# Patient Record
Sex: Female | Born: 1975 | Race: White | Hispanic: No | Marital: Single | Smoking: Current every day smoker
Health system: Southern US, Community
[De-identification: ages and names within clinical notes are randomized; demographics above are authoritative.]

---

## 2008-01-19 ENCOUNTER — Other Ambulatory Visit: Payer: Self-pay

## 2008-01-20 ENCOUNTER — Inpatient Hospital Stay: Payer: Self-pay | Admitting: Unknown Physician Specialty

## 2008-01-25 ENCOUNTER — Ambulatory Visit: Payer: Self-pay | Admitting: Unknown Physician Specialty

## 2008-01-30 ENCOUNTER — Ambulatory Visit: Payer: Self-pay | Admitting: Unknown Physician Specialty

## 2008-02-29 ENCOUNTER — Ambulatory Visit: Payer: Self-pay | Admitting: Unknown Physician Specialty

## 2013-04-16 ENCOUNTER — Emergency Department (HOSPITAL_COMMUNITY)
Admission: EM | Admit: 2013-04-16 | Discharge: 2013-04-16 | Disposition: A | Discharge status: Home / Self Care | Payer: Self-pay | Attending: Emergency Medicine | Admitting: Emergency Medicine

## 2013-04-16 ENCOUNTER — Encounter (HOSPITAL_COMMUNITY): Payer: Self-pay | Admitting: Emergency Medicine

## 2013-04-16 DIAGNOSIS — K089 Disorder of teeth and supporting structures, unspecified: Secondary | ICD-10-CM | POA: Insufficient documentation

## 2013-04-16 DIAGNOSIS — K0889 Other specified disorders of teeth and supporting structures: Secondary | ICD-10-CM

## 2013-04-16 MED ORDER — AMOXICILLIN 500 MG PO CAPS
500.0000 mg | ORAL_CAPSULE | Freq: Once | ORAL | Status: AC
  Administered 2013-04-16: 500 mg via ORAL
  Filled 2013-04-16: qty 1

## 2013-04-16 MED ORDER — TRAMADOL HCL 50 MG PO TABS: 100.0000 mg | ORAL_TABLET | Freq: Four times a day (QID) | ORAL | Status: DC | PRN

## 2013-04-16 MED ORDER — AMOXICILLIN 500 MG PO CAPS: 500.0000 mg | ORAL_CAPSULE | Freq: Three times a day (TID) | ORAL | Status: DC

## 2013-04-16 MED ORDER — METRONIDAZOLE 500 MG PO TABS: 500.0000 mg | ORAL_TABLET | Freq: Two times a day (BID) | ORAL | Status: DC

## 2013-04-16 MED ORDER — TRAMADOL HCL 50 MG PO TABS
100.0000 mg | ORAL_TABLET | Freq: Once | ORAL | Status: AC
  Administered 2013-04-16: 100 mg via ORAL
  Filled 2013-04-16: qty 2

## 2013-04-16 NOTE — ED Provider Notes (Signed)
History     CSN: [id number]  Arrival date & time 04/16/13  0919   First MD Initiated Contact with Patient 04/16/13 [phone]      No chief complaint on file.   (Consider location/radiation/quality/duration/timing/severity/associated sxs/prior treatment) HPI  Monique Jenkins is a 37 y.o. female complaining of left lower jaw tooth pain onset 5 days ago with increasing severity over the last 48 hours. Pain last night was 10 out of 10, it is 5/10 right now pain is exacerbated by chewing, she's been taking Aleve with minimal relief. Patient denies fever, difficulty swallowing her secretions, nausea vomiting.   No past medical history on file.  No past surgical history on file.  No family history on file.  History  Substance Use Topics  . Smoking status: Not on file  . Smokeless tobacco: Not on file  . Alcohol Use: Not on file    OB History   No data available      Review of Systems  Constitutional: Negative for fever.  HENT: Positive for dental problem. Negative for ear pain, facial swelling, drooling, trouble swallowing, neck pain, neck stiffness and voice change.   Respiratory: Negative for shortness of breath.   Cardiovascular: Negative for chest pain.  Gastrointestinal: Negative for nausea, vomiting, abdominal pain and diarrhea.  All other systems reviewed and are negative.    Allergies  Review of patient's allergies indicates no known allergies.  Home Medications  No current outpatient prescriptions on file.  There were no vitals taken for this visit.  Physical Exam  Nursing note and vitals reviewed. Constitutional: She is oriented to person, place, and time. She appears well-developed and well-nourished. No distress.  HENT:  Head: Normocephalic.  Mouth/Throat: Oropharynx is clear and moist.  Generally poor dentition, no gingival swelling, erythema or tenderness to palpation. Patient is handling their secretions. There is no tenderness to palpation or firmness  underneath tongue bilaterally. No trismus.   There is significant dental caries to the teeth 18 and 19 with erosion into the gumline  Eyes: Conjunctivae and EOM are normal. Pupils are equal, round, and reactive to light.  Neck: Normal range of motion.  Non-tender left sided LAD  Cardiovascular: Normal rate and regular rhythm.   Pulmonary/Chest: Effort normal. No stridor.  Musculoskeletal: Normal range of motion.  Lymphadenopathy:    She has cervical adenopathy.  Neurological: She is alert and oriented to person, place, and time.  Psychiatric: She has a normal mood and affect.    ED Course  Procedures (including critical care time)  Labs Reviewed - No data to display No results found.   1. Pain, dental       MDM   There were no vitals filed for this visit.   Monique Jenkins is a 37 y.o. female with toothache.  No gross abscess.  Exam unconcerning for Ludwig's angina or spread of infection.  Will treat with penicillin and pain medicine.  Urged patient to follow-up with dentist.  Patient requested nonnarcotic pain medication and she is in recovery.  Medications  amoxicillin (AMOXIL) capsule 500 mg (500 mg Oral Given 04/16/13 0947)  traMADol (ULTRAM) tablet 100 mg (100 mg Oral Given 04/16/13 0947)    Pt is hemodynamically stable, appropriate for, and amenable to discharge at this time. Pt verbalized understanding and agrees with care plan. Outpatient follow-up and specific return precautions discussed.    New Prescriptions   AMOXICILLIN (AMOXIL) 500 MG CAPSULE    Take 1 capsule (500 mg total) by mouth 3 (three)  times daily.   METRONIDAZOLE (FLAGYL) 500 MG TABLET    Take 1 tablet (500 mg total) by mouth 2 (two) times daily. One po bid x 7 days   TRAMADOL (ULTRAM) 50 MG TABLET    Take 2 tablets (100 mg total) by mouth every 6 (six) hours as needed for pain.           Wynetta Emery, PA-C 04/16/13 1006

## 2013-04-16 NOTE — ED Notes (Signed)
Pt c/o dental pain

## 2013-04-17 NOTE — ED Provider Notes (Signed)
Medical screening examination/treatment/procedure(s) were performed by non-physician practitioner and as supervising physician I was immediately available for consultation/collaboration.   Laray Anger, DO 04/17/13 [phone]

## 2016-02-29 ENCOUNTER — Emergency Department
Admission: EM | Admit: 2016-02-29 | Discharge: 2016-02-29 | Disposition: A | Discharge status: Home / Self Care | Payer: Self-pay | Attending: Emergency Medicine | Admitting: Emergency Medicine

## 2016-02-29 ENCOUNTER — Encounter: Payer: Self-pay | Admitting: Emergency Medicine

## 2016-02-29 ENCOUNTER — Emergency Department: Payer: Self-pay

## 2016-02-29 DIAGNOSIS — Y939 Activity, unspecified: Secondary | ICD-10-CM | POA: Insufficient documentation

## 2016-02-29 DIAGNOSIS — W500XXA Accidental hit or strike by another person, initial encounter: Secondary | ICD-10-CM | POA: Insufficient documentation

## 2016-02-29 DIAGNOSIS — S60221A Contusion of right hand, initial encounter: Secondary | ICD-10-CM

## 2016-02-29 DIAGNOSIS — F17200 Nicotine dependence, unspecified, uncomplicated: Secondary | ICD-10-CM | POA: Insufficient documentation

## 2016-02-29 DIAGNOSIS — Y929 Unspecified place or not applicable: Secondary | ICD-10-CM | POA: Insufficient documentation

## 2016-02-29 DIAGNOSIS — Y999 Unspecified external cause status: Secondary | ICD-10-CM | POA: Insufficient documentation

## 2016-02-29 MED ORDER — HYDROCODONE-ACETAMINOPHEN 5-325 MG PO TABS: 1.0000 | ORAL_TABLET | ORAL | Status: AC | PRN

## 2016-02-29 MED ORDER — MELOXICAM 15 MG PO TABS: 15.0000 mg | ORAL_TABLET | Freq: Every day | ORAL | Status: AC
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
## 2016-02-29 NOTE — Discharge Instructions (Signed)
Hand Contusion °A hand contusion is a deep bruise on your hand area. Contusions are the result of an injury that caused bleeding under the skin. The contusion may turn blue, purple, or yellow. Minor injuries will give you a painless contusion, but more severe contusions may stay painful and swollen for a few weeks. °CAUSES  °A contusion is usually caused by a blow, trauma, or direct force to an area of the body. °SYMPTOMS  °· Swelling and redness of the injured area. °· Discoloration of the injured area. °· Tenderness and soreness of the injured area. °· Pain. °DIAGNOSIS  °The diagnosis can be made by taking a history and performing a physical exam. An X-ray, CT scan, or MRI may be needed to determine if there were any associated injuries, such as broken bones (fractures). °TREATMENT  °Often, the best treatment for a hand contusion is resting, elevating, icing, and applying cold compresses to the injured area. Over-the-counter medicines may also be recommended for pain control. °HOME CARE INSTRUCTIONS  °· Put ice on the injured area. °· Put ice in a plastic bag. °· Place a towel between your skin and the bag. °· Leave the ice on for 15-20 minutes, 03-04 times a day. °· Only take over-the-counter or prescription medicines as directed by your caregiver. Your caregiver may recommend avoiding anti-inflammatory medicines (aspirin, ibuprofen, and naproxen) for 48 hours because these medicines may increase bruising. °· If told, use an elastic wrap as directed. This can help reduce swelling. You may remove the wrap for sleeping, showering, and bathing. If your fingers become numb, cold, or blue, take the wrap off and reapply it more loosely. °· Elevate your hand with pillows to reduce swelling. °· Avoid overusing your hand if it is painful. °SEEK IMMEDIATE MEDICAL CARE IF:  °· You have increased redness, swelling, or pain in your hand. °· Your swelling or pain is not relieved with medicines. °· You have loss of feeling in  your hand or are unable to move your fingers. °· Your hand turns cold or blue. °· You have pain when you move your fingers. °· Your hand becomes warm to the touch. °· Your contusion does not improve in 2 days. °MAKE SURE YOU:  °· Understand these instructions. °· Will watch your condition. °· Will get help right away if you are not doing well or get worse. °  °This information is not intended to replace advice given to you by your health care provider. Make sure you discuss any questions you have with your health care provider. °  °Document Released: 04/08/2002 Document Revised: 07/11/2012 Document Reviewed: 04/09/2012 °Elsevier Interactive Patient Education ©2016 [employer] ° °Cryotherapy °Cryotherapy means treatment with cold. Ice or gel packs can be used to reduce both pain and swelling. Ice is the most helpful within the first 24 to 48 hours after an injury or flare-up from overusing a muscle or joint. Sprains, strains, spasms, burning pain, shooting pain, and aches can all be eased with ice. Ice can also be used when recovering from surgery. Ice is effective, has very few side effects, and is safe for most people to use. °PRECAUTIONS  °Ice is not a safe treatment option for people with: °· Raynaud phenomenon. This is a condition affecting small blood vessels in the extremities. Exposure to cold may cause your problems to return. °· Cold hypersensitivity. There are many forms of cold hypersensitivity, including: °¨ Cold urticaria. Red, itchy hives appear on the skin when the tissues begin to warm   after being iced. °¨ Cold erythema. This is a red, itchy rash caused by exposure to cold. °¨ Cold hemoglobinuria. Red blood cells break down when the tissues begin to warm after being iced. The hemoglobin that carry oxygen are passed into the urine because they cannot combine with blood proteins fast enough. °· Numbness or altered sensitivity in the area being iced. °If you have any of the following conditions, do  not use ice until you have discussed cryotherapy with your caregiver: °· Heart conditions, such as arrhythmia, angina, or chronic heart disease. °· High blood pressure. °· Healing wounds or open skin in the area being iced. °· Current infections. °· Rheumatoid arthritis. °· Poor circulation. °· Diabetes. °Ice slows the blood flow in the region it is applied. This is beneficial when trying to stop inflamed tissues from spreading irritating chemicals to surrounding tissues. However, if you expose your skin to cold temperatures for too long or without the proper protection, you can damage your skin or nerves. Watch for signs of skin damage due to cold. °HOME CARE INSTRUCTIONS °Follow these tips to use ice and cold packs safely. °· Place a dry or damp towel between the ice and skin. A damp towel will cool the skin more quickly, so you may need to shorten the time that the ice is used. °· For a more rapid response, add gentle compression to the ice. °· Ice for no more than 10 to 20 minutes at a time. The bonier the area you are icing, the less time it will take to get the benefits of ice. °· Check your skin after 5 minutes to make sure there are no signs of a poor response to cold or skin damage. °· Rest 20 minutes or more between uses. °· Once your skin is numb, you can end your treatment. You can test numbness by very lightly touching your skin. The touch should be so light that you do not see the skin dimple from the pressure of your fingertip. When using ice, most people will feel these normal sensations in this order: cold, burning, aching, and numbness. °· Do not use ice on someone who cannot communicate their responses to pain, such as small children or people with dementia. °HOW TO MAKE AN ICE PACK °Ice packs are the most common way to use ice therapy. Other methods include ice massage, ice baths, and cryosprays. Muscle creams that cause a cold, tingly feeling do not offer the same benefits that ice offers and  should not be used as a substitute unless recommended by your caregiver. °To make an ice pack, do one of the following: °· Place crushed ice or a bag of frozen vegetables in a sealable plastic bag. Squeeze out the excess air. Place this bag inside another plastic bag. Slide the bag into a pillowcase or place a damp towel between your skin and the bag. °· Mix 3 parts water with 1 part rubbing alcohol. Freeze the mixture in a sealable plastic bag. When you remove the mixture from the freezer, it will be slushy. Squeeze out the excess air. Place this bag inside another plastic bag. Slide the bag into a pillowcase or place a damp towel between your skin and the bag. °SEEK MEDICAL CARE IF: °· You develop white spots on your skin. This may give the skin a blotchy (mottled) appearance. °· Your skin turns blue or pale. °· Your skin becomes waxy or hard. °· Your swelling gets worse. °MAKE SURE YOU:  °· Understand   these instructions. °· Will watch your condition. °· Will get help right away if you are not doing well or get worse. °  °This information is not intended to replace advice given to you by your health care provider. Make sure you discuss any questions you have with your health care provider. °  °Document Released: 06/13/2011 Document Revised: 11/07/2014 Document Reviewed: 06/13/2011 °Elsevier Interactive Patient Education ©2016 Elsevier Inc. ° °
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
## 2016-12-11 ENCOUNTER — Encounter: Payer: Self-pay | Admitting: Emergency Medicine

## 2016-12-11 ENCOUNTER — Emergency Department
Admission: EM | Admit: 2016-12-11 | Discharge: 2016-12-11 | Disposition: A | Discharge status: Home / Self Care | Payer: Self-pay | Attending: Emergency Medicine | Admitting: Emergency Medicine

## 2016-12-11 DIAGNOSIS — J0100 Acute maxillary sinusitis, unspecified: Secondary | ICD-10-CM | POA: Insufficient documentation

## 2016-12-11 DIAGNOSIS — F17200 Nicotine dependence, unspecified, uncomplicated: Secondary | ICD-10-CM | POA: Insufficient documentation

## 2016-12-11 DIAGNOSIS — Z79899 Other long term (current) drug therapy: Secondary | ICD-10-CM | POA: Insufficient documentation

## 2016-12-11 DIAGNOSIS — J029 Acute pharyngitis, unspecified: Secondary | ICD-10-CM | POA: Insufficient documentation

## 2016-12-11 MED ORDER — FIRST-DUKES MOUTHWASH MT SUSP: 10.0000 mL | Freq: Four times a day (QID) | OROMUCOSAL | 0 refills | Status: AC

## 2016-12-11 MED ORDER — FEXOFENADINE-PSEUDOEPHED ER 60-120 MG PO TB12: 1.0000 | ORAL_TABLET | Freq: Two times a day (BID) | ORAL | 0 refills | Status: AC

## 2016-12-11 MED ORDER — DIPHENHYDRAMINE HCL 12.5 MG/5ML PO ELIX
25.0000 mg | ORAL_SOLUTION | Freq: Once | ORAL | Status: AC
  Administered 2016-12-11: 25 mg via ORAL
  Filled 2016-12-11: qty 10

## 2016-12-11 MED ORDER — LIDOCAINE VISCOUS 2 % MT SOLN: 5.0000 mL | Freq: Four times a day (QID) | OROMUCOSAL | 0 refills | Status: AC | PRN

## 2016-12-11 MED ORDER — AMOXICILLIN 500 MG PO CAPS: 500.0000 mg | ORAL_CAPSULE | Freq: Three times a day (TID) | ORAL | 0 refills | Status: AC

## 2016-12-11 MED ORDER — LIDOCAINE VISCOUS 2 % MT SOLN
15.0000 mL | Freq: Once | OROMUCOSAL | Status: AC
  Administered 2016-12-11: 15 mL via OROMUCOSAL
  Filled 2016-12-11: qty 15

## 2016-12-11 NOTE — ED Notes (Signed)

## 2016-12-11 NOTE — ED Provider Notes (Signed)
[facility] Emergency Department Provider Note   ____________________________________________   First MD Initiated Contact with Patient 12/11/16 1535     (approximate)  I have reviewed the triage vital signs and the nursing notes.   HISTORY  Chief Complaint Generalized Body Aches and Nasal Congestion    HPI Monique Jenkins is a 41 y.o. female patient complain about 3 weeks of head congestion and generalized body aches. Patient also states she has a sore throat. Patient stated this been intermittent fever but has not been febrile the last couple days.Patient rates the pain as a 3/10. No palliative measures taken for her complaint.   History reviewed. No pertinent past medical history.  There are no active problems to display for this patient.   Past Surgical History:  Procedure Laterality Date  . CESAREAN SECTION      Prior to Admission medications   Medication Sig Start Date End Date Taking? Authorizing Provider  amoxicillin (AMOXIL) 500 MG capsule Take 1 capsule (500 mg total) by mouth 3 (three) times daily. 12/11/16   Joni Reining, PA-C  Diphenhyd-Hydrocort-Nystatin (FIRST-DUKES MOUTHWASH) SUSP Use as directed 10 mLs in the mouth or throat 4 (four) times daily. 12/11/16   Joni Reining, PA-C  fexofenadine-pseudoephedrine (ALLEGRA-D) 60-120 MG 12 hr tablet Take 1 tablet by mouth 2 (two) times daily. 12/11/16   Joni Reining, PA-C  HYDROcodone-acetaminophen (NORCO) 5-325 MG tablet Take 1-2 tablets by mouth every 4 (four) hours as needed for moderate pain. 02/29/16   Charmayne Sheer Beers, PA-C  lidocaine (XYLOCAINE) 2 % solution Use as directed 5 mLs in the mouth or throat every 6 (six) hours as needed for mouth pain. Mixed 5 mL with [facility] mouthwash for swish and swallow. 12/11/16   Joni Reining, PA-C  meloxicam (MOBIC) 15 MG tablet Take 1 tablet (15 mg total) by mouth daily. 02/29/16   Evangeline Dakin, PA-C    Allergies Aspirin  No family history on  file.  Social History Social History  Substance Use Topics  . Smoking status: Current Every Day Smoker    Packs/day: 1.00  . Smokeless tobacco: Never Used  . Alcohol use Yes    Review of Systems Constitutional: No fever/chills. Generalized body aches. Eyes: No visual changes. ENT: Sinus and facial pressure. Sore throat.. Cardiovascular: Denies chest pain. Respiratory: Denies shortness of breath. Gastrointestinal: No abdominal pain.  No nausea, no vomiting.  No diarrhea.  No constipation. Genitourinary: Negative for dysuria. Musculoskeletal: Negative for back pain. Skin: Negative for rash. Neurological: Positive for frontal headache but denies focal weakness or numbness. Allergic/Immunilogical: Aspirin   ____________________________________________   PHYSICAL EXAM:  VITAL SIGNS: ED Triage Vitals  Enc Vitals Group     BP 12/11/16 1404 123/84     Pulse Rate 12/11/16 1404 86     Resp 12/11/16 1404 16     Temp 12/11/16 1404 98.5 F (36.9 C)     Temp Source 12/11/16 1404 Oral     SpO2 12/11/16 1404 99 %     Weight 12/11/16 1405 130 lb (59 kg)     Height 12/11/16 1405 5\' 3"  (1.6 m)     Head Circumference --      Peak Flow --      Pain Score 12/11/16 1407 3     Pain Loc --      Pain Edu? --      Excl. in GC? --     Constitutional: Alert and oriented. Well appearing  and in no acute distress. Eyes: Conjunctivae are normal. PERRL. EOMI. Head: Atraumatic. Nose:Edematous nasal turbinates with thick rhinorrhea. Bilateral frontal and maxillary sinus guarding with palpation. Mouth/Throat: Mucous membranes are moist.  Oropharynx erythematous. Neck: No stridor. No cervical spine tenderness to palpation. Hematological/Lymphatic/Immunilogical: No cervical lymphadenopathy. Cardiovascular: Normal rate, regular rhythm. Grossly normal heart sounds.  Good peripheral circulation. Respiratory: Normal respiratory effort.  No retractions. Lungs CTAB. Gastrointestinal: Soft and  nontender. No distention. No abdominal bruits. No CVA tenderness. Musculoskeletal: No lower extremity tenderness nor edema.  No joint effusions. Neurologic:  Normal speech and language. No gross focal neurologic deficits are appreciated. No gait instability. Skin:  Skin is warm, dry and intact. No rash noted. Psychiatric: Mood and affect are normal. Speech and behavior are normal.  ____________________________________________   LABS (all labs ordered are listed, but only abnormal results are displayed)  Labs Reviewed - No data to display ____________________________________________  EKG   ____________________________________________  RADIOLOGY   ____________________________________________   PROCEDURES  Procedure(s) performed: None  Procedures  Critical Care performed: No  ____________________________________________   INITIAL IMPRESSION / ASSESSMENT AND PLAN / ED COURSE  Pertinent labs & imaging results that were available during my care of the patient were reviewed by me and considered in my medical decision making (see chart for details).  Sinusitis and pharyngitis. Patient given discharge care instructions. Patient given prescription for Allegra-D, amoxicillin, [facility] mouthwash, and viscous lidocaine. Patient given a work note. Patient advised to [facility].      ____________________________________________   FINAL CLINICAL IMPRESSION(S) / ED DIAGNOSES  Final diagnoses:  Acute maxillary sinusitis, recurrence not specified  Pharyngitis, unspecified etiology      NEW MEDICATIONS STARTED DURING THIS VISIT:  New Prescriptions   AMOXICILLIN (AMOXIL) 500 MG CAPSULE    Take 1 capsule (500 mg total) by mouth 3 (three) times daily.   DIPHENHYD-HYDROCORT-NYSTATIN (FIRST-DUKES MOUTHWASH) SUSP    Use as directed 10 mLs in the mouth or throat 4 (four) times daily.   FEXOFENADINE-PSEUDOEPHEDRINE (ALLEGRA-D) 60-120 MG 12 HR  TABLET    Take 1 tablet by mouth 2 (two) times daily.   LIDOCAINE (XYLOCAINE) 2 % SOLUTION    Use as directed 5 mLs in the mouth or throat every 6 (six) hours as needed for mouth pain. Mixed 5 mL with [facility] mouthwash for swish and swallow.     Note:  This document was prepared using Dragon voice recognition software and may include unintentional dictation errors.    Joni ReiningRonald K Abie Killian, PA-C 12/11/16 1550    Emily FilbertJonathan E Williams, MD 12/11/16 [phone]

## 2016-12-11 NOTE — Discharge Instructions (Signed)
Take ibuprofen for body aches and fever.

## 2016-12-11 NOTE — ED Triage Notes (Signed)
Pt comes into the ED via POV c/o head congestion and generalized body aches for a couple of weeks.  Patient presents in NAD at this time with even and unlabored respirations.  Denies any fevers at this time.

## 2017-01-25 ENCOUNTER — Emergency Department
Admission: EM | Admit: 2017-01-25 | Discharge: 2017-01-25 | Disposition: A | Discharge status: Home / Self Care | Payer: Self-pay | Attending: Emergency Medicine | Admitting: Emergency Medicine

## 2017-01-25 ENCOUNTER — Encounter: Payer: Self-pay | Admitting: Emergency Medicine

## 2017-01-25 DIAGNOSIS — F17200 Nicotine dependence, unspecified, uncomplicated: Secondary | ICD-10-CM | POA: Insufficient documentation

## 2017-01-25 DIAGNOSIS — J069 Acute upper respiratory infection, unspecified: Secondary | ICD-10-CM | POA: Insufficient documentation

## 2017-01-25 MED ORDER — PREDNISONE 10 MG (21) PO TBPK: ORAL_TABLET | ORAL | 0 refills | Status: AC

## 2017-01-25 MED ORDER — LEVOFLOXACIN 500 MG PO TABS
500.0000 mg | ORAL_TABLET | Freq: Once | ORAL | Status: AC
  Administered 2017-01-25: 500 mg via ORAL
  Filled 2017-01-25: qty 1

## 2017-01-25 MED ORDER — PREDNISONE 20 MG PO TABS
60.0000 mg | ORAL_TABLET | Freq: Once | ORAL | Status: AC
  Administered 2017-01-25: 60 mg via ORAL
  Filled 2017-01-25: qty 3

## 2017-01-25 MED ORDER — LEVOFLOXACIN 500 MG PO TABS: 500.0000 mg | ORAL_TABLET | Freq: Every day | ORAL | 0 refills | Status: AC

## 2017-01-25 NOTE — ED Triage Notes (Signed)
Patient ambulatory to triage with steady gait, without difficulty or distress noted, mask in place; pt reports dx with sinus infection month ago; c/o persistent sinus congestion & hoarseness and sinus pain/pressure

## 2017-01-25 NOTE — ED Provider Notes (Signed)
[facility] Emergency Department Provider Note  ____________________________________________  Time seen: Approximately 10:33 PM  I have reviewed the triage vital signs and the nursing notes.   HISTORY  Chief Complaint Nasal Congestion    HPI Monique Jenkins is a 41 y.o. female that presents to the emergency department with headache, chills, nasal congestion, productive cough with yellow sputum for 3 months. Patient states that she wakes up in the middle and night and has soaked through the sheets with sweat. Patient states that she was seen in the emergency department for same symptoms one month ago and was given amoxicillin. Symptoms have not improved at all.Patient smokes a pack of cigarettes a day since she was 39. He denies COPD, asthma, asthma allergies. She denies shortness of breath, chest pain, nausea, vomiting, abdominal pain.   History reviewed. No pertinent past medical history.  There are no active problems to display for this patient.   Past Surgical History:  Procedure Laterality Date  . CESAREAN SECTION      Prior to Admission medications   Medication Sig Start Date End Date Taking? Authorizing Provider  amoxicillin (AMOXIL) 500 MG capsule Take 1 capsule (500 mg total) by mouth 3 (three) times daily. 12/11/16   Joni Reining, PA-C  Diphenhyd-Hydrocort-Nystatin (FIRST-DUKES MOUTHWASH) SUSP Use as directed 10 mLs in the mouth or throat 4 (four) times daily. 12/11/16   Joni Reining, PA-C  fexofenadine-pseudoephedrine (ALLEGRA-D) 60-120 MG 12 hr tablet Take 1 tablet by mouth 2 (two) times daily. 12/11/16   Joni Reining, PA-C  HYDROcodone-acetaminophen (NORCO) 5-325 MG tablet Take 1-2 tablets by mouth every 4 (four) hours as needed for moderate pain. 02/29/16   Charmayne Sheer Beers, PA-C  levofloxacin (LEVAQUIN) 500 MG tablet Take 1 tablet (500 mg total) by mouth daily. 01/25/17 02/04/17  Enid Derry, PA-C  lidocaine (XYLOCAINE) 2 % solution Use as  directed 5 mLs in the mouth or throat every 6 (six) hours as needed for mouth pain. Mixed 5 mL with [facility] mouthwash for swish and swallow. 12/11/16   Joni Reining, PA-C  meloxicam (MOBIC) 15 MG tablet Take 1 tablet (15 mg total) by mouth daily. 02/29/16   Charmayne Sheer Beers, PA-C  predniSONE (STERAPRED UNI-PAK 21 TAB) 10 MG (21) TBPK tablet Take 6 tablets on day 1, take 5 tablets on day 2, take 4 tablets on day 3, take 3 tablets on day 4, take 2 tablets on day 5, take 1 tablet on day 6 01/25/17   Enid Derry, PA-C    Allergies Aspirin  No family history on file.  Social History Social History  Substance Use Topics  . Smoking status: Current Every Day Smoker    Packs/day: 1.00  . Smokeless tobacco: Never Used  . Alcohol use Yes     Review of Systems  Constitutional: Positive for fever/chills Eyes: No visual changes. No discharge. ENT: Positive for congestion and rhinorrhea. Cardiovascular: No chest pain. Respiratory: Positive for cough. No SOB. Gastrointestinal: No abdominal pain.  No nausea, no vomiting.  No diarrhea.  No constipation. Musculoskeletal: Negative for musculoskeletal pain. Skin: Negative for rash, abrasions, lacerations, ecchymosis. Neurological: Negative for headaches.   ____________________________________________   PHYSICAL EXAM:  VITAL SIGNS: ED Triage Vitals  Enc Vitals Group     BP 01/25/17 2137 120/80     Pulse Rate 01/25/17 2137 78     Resp 01/25/17 2137 18     Temp 01/25/17 2137 98.1 F (36.7 C)  Temp Source 01/25/17 2137 Oral     SpO2 01/25/17 2137 100 %     Weight 01/25/17 2138 130 lb (59 kg)     Height 01/25/17 2138 5\' 3"  (1.6 m)     Head Circumference --      Peak Flow --      Pain Score 01/25/17 2137 7     Pain Loc --      Pain Edu? --      Excl. in GC? --      Constitutional: Alert and oriented. Well appearing and in no acute distress. Eyes: Conjunctivae are normal. PERRL. EOMI. No discharge. Head: Atraumatic. ENT: No frontal  and maxillary sinus tenderness.      Ears: Tympanic membranes pearly gray with good landmarks. No discharge.      Nose: Mild congestion/rhinnorhea.      Mouth/Throat: Mucous membranes are moist. Oropharynx non-erythematous. Tonsils not enlarged. No exudates. Uvula midline. Neck: No stridor.   Hematological/Lymphatic/Immunilogical: No cervical lymphadenopathy. Cardiovascular: Normal rate, regular rhythm.  Good peripheral circulation. Respiratory: Normal respiratory effort without tachypnea or retractions. Lungs CTAB. Good air entry to the bases with no decreased or absent breath sounds. Gastrointestinal: Bowel sounds 4 quadrants. Soft and nontender to palpation. No guarding or rigidity. No palpable masses. No distention. Musculoskeletal: Full range of motion to all extremities. No gross deformities appreciated. Neurologic:  Normal speech and language. No gross focal neurologic deficits are appreciated.  Skin:  Skin is warm, dry and intact. No rash noted.   ____________________________________________   LABS (all labs ordered are listed, but only abnormal results are displayed)  Labs Reviewed - No data to display ____________________________________________  EKG   ____________________________________________  RADIOLOGY  No results found.  ____________________________________________    PROCEDURES  Procedure(s) performed:    Procedures    Medications  levofloxacin (LEVAQUIN) tablet 500 mg (500 mg Oral Given 01/25/17 2252)  predniSONE (DELTASONE) tablet 60 mg (60 mg Oral Given 01/25/17 2252)     ____________________________________________   INITIAL IMPRESSION / ASSESSMENT AND PLAN / ED COURSE  Pertinent labs & imaging results that were available during my care of the patient were reviewed by me and considered in my medical decision making (see chart for details).  Review of the [place] CSRS was performed in accordance of the [facility] prior to dispensing any controlled  drugs.     Patient's diagnosis is consistent with upper respiratory infection. Vital signs and exam are reassuring. Benefits of doing a chest x-ray were discussed with patient and I explained that results would not change my course of treatment. Patient preferred not to do the chest x-ray at this time.  Patient appears well and is staying well hydrated. Patient feels comfortable going home. Patient will be discharged home with prescriptions for levofloxacin and prednisone. She was given a dose of levofloxacin and prednisone in ED. Patient is to follow up with PCP as needed or otherwise directed. Patient is given ED precautions to return to the ED for any worsening or new symptoms.     ____________________________________________  FINAL CLINICAL IMPRESSION(S) / ED DIAGNOSES  Final diagnoses:  Upper respiratory tract infection, unspecified type      NEW MEDICATIONS STARTED DURING THIS VISIT:  Discharge Medication List as of 01/25/2017 10:45 PM    START taking these medications   Details  levofloxacin (LEVAQUIN) 500 MG tablet Take 1 tablet (500 mg total) by mouth daily., Starting Wed 01/25/2017, Until Sat 02/04/2017, Print    predniSONE (STERAPRED UNI-PAK 21 TAB) 10  MG (21) TBPK tablet Take 6 tablets on day 1, take 5 tablets on day 2, take 4 tablets on day 3, take 3 tablets on day 4, take 2 tablets on day 5, take 1 tablet on day 6, Print            This chart was dictated using voice recognition software/Dragon. Despite best efforts to proofread, errors can occur which can change the meaning. Any change was purely unintentional.    Enid DerryAshley Ragen Laver, PA-C 01/25/17 2317    Myrna Blazeravid Matthew Schaevitz, MD 01/25/17 [phone]

## 2018-09-04 ENCOUNTER — Emergency Department
Admission: EM | Admit: 2018-09-04 | Discharge: 2018-09-04 | Disposition: A | Discharge status: Home / Self Care | Payer: Self-pay | Attending: Emergency Medicine | Admitting: Emergency Medicine

## 2018-09-04 ENCOUNTER — Encounter: Payer: Self-pay | Admitting: Emergency Medicine

## 2018-09-04 ENCOUNTER — Other Ambulatory Visit: Payer: Self-pay

## 2018-09-04 ENCOUNTER — Emergency Department: Payer: Self-pay

## 2018-09-04 DIAGNOSIS — N949 Unspecified condition associated with female genital organs and menstrual cycle: Secondary | ICD-10-CM

## 2018-09-04 DIAGNOSIS — N719 Inflammatory disease of uterus, unspecified: Secondary | ICD-10-CM | POA: Insufficient documentation

## 2018-09-04 DIAGNOSIS — F17210 Nicotine dependence, cigarettes, uncomplicated: Secondary | ICD-10-CM | POA: Insufficient documentation

## 2018-09-04 DIAGNOSIS — K529 Noninfective gastroenteritis and colitis, unspecified: Secondary | ICD-10-CM | POA: Insufficient documentation

## 2018-09-04 DIAGNOSIS — R102 Pelvic and perineal pain: Secondary | ICD-10-CM

## 2018-09-04 DIAGNOSIS — Z79899 Other long term (current) drug therapy: Secondary | ICD-10-CM | POA: Insufficient documentation

## 2018-09-04 DIAGNOSIS — R109 Unspecified abdominal pain: Secondary | ICD-10-CM | POA: Insufficient documentation

## 2018-09-04 DIAGNOSIS — N83299 Other ovarian cyst, unspecified side: Secondary | ICD-10-CM | POA: Insufficient documentation

## 2018-09-04 LAB — URINALYSIS, COMPLETE (UACMP) WITH MICROSCOPIC
BILIRUBIN URINE: NEGATIVE
Bacteria, UA: NONE SEEN
GLUCOSE, UA: NEGATIVE mg/dL
HGB URINE DIPSTICK: NEGATIVE
KETONES UR: NEGATIVE mg/dL
NITRITE: NEGATIVE
PH: 7 (ref 5.0–8.0)
PROTEIN: NEGATIVE mg/dL
Specific Gravity, Urine: 1.003 — ABNORMAL LOW (ref 1.005–1.030)

## 2018-09-04 LAB — COMPREHENSIVE METABOLIC PANEL
ALBUMIN: 4 g/dL (ref 3.5–5.0)
ALT: 15 U/L (ref 0–44)
ANION GAP: 6 (ref 5–15)
AST: 19 U/L (ref 15–41)
Alkaline Phosphatase: 54 U/L (ref 38–126)
BILIRUBIN TOTAL: 0.6 mg/dL (ref 0.3–1.2)
BUN: 8 mg/dL (ref 6–20)
CO2: 22 mmol/L (ref 22–32)
Calcium: 8.4 mg/dL — ABNORMAL LOW (ref 8.9–10.3)
Chloride: 111 mmol/L (ref 98–111)
Creatinine, Ser: 0.77 mg/dL (ref 0.44–1.00)
GFR calc non Af Amer: >60 mL/min (ref >60)
GLUCOSE: 80 mg/dL (ref 70–99)
Potassium: 4.1 mmol/L (ref 3.5–5.1)
SODIUM: 139 mmol/L (ref 135–145)
TOTAL PROTEIN: 6.7 g/dL (ref 6.5–8.1)

## 2018-09-04 LAB — CBC
HCT: 41.7 % (ref 36.0–46.0)
HEMOGLOBIN: 14.1 g/dL (ref 12.0–15.0)
MCH: 32 pg (ref 26.0–34.0)
MCHC: 33.8 g/dL (ref 30.0–36.0)
MCV: 94.6 fL (ref 80.0–100.0)
Platelets: 224 10*3/uL (ref 150–400)
RBC: 4.41 MIL/uL (ref 3.87–5.11)
RDW: 12.2 % (ref 11.5–15.5)
WBC: 9.8 10*3/uL (ref 4.0–10.5)
nRBC: 0 % (ref 0.0–0.2)

## 2018-09-04 LAB — HCG, QUANTITATIVE, PREGNANCY: hCG, Beta Chain, Quant, S: <1 m[IU]/mL (ref <5)

## 2018-09-04 LAB — LIPASE, BLOOD: Lipase: 24 U/L (ref 11–51)

## 2018-09-04 LAB — POCT PREGNANCY, URINE: Preg Test, Ur: NEGATIVE

## 2018-09-04 MED ORDER — METRONIDAZOLE 500 MG PO TABS: 500.0000 mg | ORAL_TABLET | Freq: Three times a day (TID) | ORAL | 0 refills | Status: AC

## 2018-09-04 MED ORDER — ONDANSETRON HCL 4 MG/2ML IJ SOLN
4.0000 mg | Freq: Once | INTRAMUSCULAR | Status: AC
  Administered 2018-09-04: 4 mg via INTRAVENOUS
  Filled 2018-09-04: qty 2

## 2018-09-04 MED ORDER — IOHEXOL 300 MG/ML  SOLN
100.0000 mL | Freq: Once | INTRAMUSCULAR | Status: AC | PRN
  Administered 2018-09-04: 100 mL via INTRAVENOUS

## 2018-09-04 MED ORDER — AMOXICILLIN-POT CLAVULANATE 875-125 MG PO TABS: 1.0000 | ORAL_TABLET | Freq: Two times a day (BID) | ORAL | 0 refills | Status: AC

## 2018-09-04 MED ORDER — KETOROLAC TROMETHAMINE 30 MG/ML IJ SOLN
15.0000 mg | Freq: Once | INTRAMUSCULAR | Status: AC
  Administered 2018-09-04: 15 mg via INTRAVENOUS
  Filled 2018-09-04: qty 1

## 2018-09-04 MED ORDER — SODIUM CHLORIDE 0.9 % IV BOLUS
1000.0000 mL | Freq: Once | INTRAVENOUS | Status: AC
  Administered 2018-09-04: 1000 mL via INTRAVENOUS

## 2018-09-04 MED ORDER — FENTANYL CITRATE (PF) 100 MCG/2ML IJ SOLN
50.0000 ug | Freq: Once | INTRAMUSCULAR | Status: AC
  Administered 2018-09-04: 50 ug via INTRAVENOUS
  Filled 2018-09-04: qty 2

## 2018-09-04 NOTE — ED Provider Notes (Signed)
[facility] Emergency Department Provider Note  ____________________________________________  Time seen: Approximately 8:45 AM  I have reviewed the triage vital signs and the nursing notes.   HISTORY  Chief Complaint Abdominal Pain; Emesis; and Diarrhea   HPI Monique Jenkins is a 42 y.o. female no significant past medical history who presents for evaluation of nausea, vomiting, diarrhea and abdominal pain.  Patient reports that her symptoms started 3 days ago with nausea and vomiting.  That has resolved however the diarrhea has become significantly worse.  She reports at least 8 episodes of watery diarrhea in the last 24 hours.  She is reporting lower abdominal cramping radiating to her rectum has been getting progressively worse with bowel movements.  No melena or hematochezia, no coffee-ground emesis or hematemesis.  No fever or chills, no dysuria or hematuria.  PMH None - reviewed  Past Surgical History:  Procedure Laterality Date  . CESAREAN SECTION      Prior to Admission medications   Medication Sig Start Date End Date Taking? Authorizing Provider  amoxicillin (AMOXIL) 500 MG capsule Take 1 capsule (500 mg total) by mouth 3 (three) times daily. 12/11/16   Joni Reining, PA-C  amoxicillin-clavulanate (AUGMENTIN) 875-125 MG tablet Take 1 tablet by mouth 2 (two) times daily for 10 days. 09/04/18 09/14/18  Nita Sickle, MD  Diphenhyd-Hydrocort-Nystatin (FIRST-DUKES MOUTHWASH) SUSP Use as directed 10 mLs in the mouth or throat 4 (four) times daily. 12/11/16   Joni Reining, PA-C  fexofenadine-pseudoephedrine (ALLEGRA-D) 60-120 MG 12 hr tablet Take 1 tablet by mouth 2 (two) times daily. 12/11/16   Joni Reining, PA-C  HYDROcodone-acetaminophen (NORCO) 5-325 MG tablet Take 1-2 tablets by mouth every 4 (four) hours as needed for moderate pain. 02/29/16   Beers, Charmayne Sheer, PA-C  lidocaine (XYLOCAINE) 2 % solution Use as directed 5 mLs in the mouth or throat  every 6 (six) hours as needed for mouth pain. Mixed 5 mL with [facility] mouthwash for swish and swallow. 12/11/16   Joni Reining, PA-C  meloxicam (MOBIC) 15 MG tablet Take 1 tablet (15 mg total) by mouth daily. 02/29/16   Beers, Charmayne Sheer, PA-C  metroNIDAZOLE (FLAGYL) 500 MG tablet Take 1 tablet (500 mg total) by mouth 3 (three) times daily for 10 days. 09/04/18 09/14/18  Nita Sickle, MD  predniSONE (STERAPRED UNI-PAK 21 TAB) 10 MG (21) TBPK tablet Take 6 tablets on day 1, take 5 tablets on day 2, take 4 tablets on day 3, take 3 tablets on day 4, take 2 tablets on day 5, take 1 tablet on day 6 01/25/17   Enid Derry, PA-C    Allergies Aspirin  No family history on file.  Social History Social History   Tobacco Use  . Smoking status: Current Every Day Smoker    Packs/day: 1.00  . Smokeless tobacco: Never Used  Substance Use Topics  . Alcohol use: Not Currently  . Drug use: Never    Review of Systems  Constitutional: Negative for fever. Eyes: Negative for visual changes. ENT: Negative for sore throat. Neck: No neck pain  Cardiovascular: Negative for chest pain. Respiratory: Negative for shortness of breath. Gastrointestinal: + lower abdominal pain, vomiting and diarrhea. Genitourinary: Negative for dysuria. Musculoskeletal: Negative for back pain. Skin: Negative for rash. Neurological: Negative for headaches, weakness or numbness. Psych: No SI or HI  ____________________________________________   PHYSICAL EXAM:  VITAL SIGNS: Vitals:   09/04/18 1100 09/04/18 1200  BP: 112/86 126/76  Pulse: 67  64  Resp:    Temp:    SpO2: 98% 99%   Constitutional: Alert and oriented. Well appearing and in no apparent distress. HEENT:      Head: Normocephalic and atraumatic.         Eyes: Conjunctivae are normal. Sclera is non-icteric.       Mouth/Throat: Mucous membranes are moist.       Neck: Supple with no signs of meningismus. Cardiovascular: Regular rate and rhythm. No  murmurs, gallops, or rubs. 2+ symmetrical distal pulses are present in all extremities. No JVD. Respiratory: Normal respiratory effort. Lungs are clear to auscultation bilaterally. No wheezes, crackles, or rhonchi.  Gastrointestinal: Soft, diffuse tenderness on the lower quadrants, non distended with positive bowel sounds. No rebound or guarding. Genitourinary: No CVA tenderness. Musculoskeletal: Nontender with normal range of motion in all extremities. No edema, cyanosis, or erythema of extremities. Neurologic: Normal speech and language. Face is symmetric. Moving all extremities. No gross focal neurologic deficits are appreciated. Skin: Skin is warm, dry and intact. No rash noted. Psychiatric: Mood and affect are normal. Speech and behavior are normal.  ____________________________________________   LABS (all labs ordered are listed, but only abnormal results are displayed)  Labs Reviewed  COMPREHENSIVE METABOLIC PANEL - Abnormal; Notable for the following components:      Result Value   Calcium 8.4 (*)    All other components within normal limits  URINALYSIS, COMPLETE (UACMP) WITH MICROSCOPIC - Abnormal; Notable for the following components:   Color, Urine STRAW (*)    APPearance CLEAR (*)    Specific Gravity, Urine 1.003 (*)    Leukocytes, UA TRACE (*)    All other components within normal limits  LIPASE, BLOOD  CBC  HCG, QUANTITATIVE, PREGNANCY  POCT PREGNANCY, URINE  POC URINE PREG, ED   ____________________________________________  EKG  none  ____________________________________________  RADIOLOGY  I have personally reviewed the images performed during this visit and I agree with the Radiologist's read.   Interpretation by Radiologist:  Ct Abdomen Pelvis W Contrast  Result Date: 09/04/2018 CLINICAL DATA:  Nausea and vomiting for 4 days. Abdominal pain, unspecified. EXAM: CT ABDOMEN AND PELVIS WITH CONTRAST TECHNIQUE: Multidetector CT imaging of the abdomen and  pelvis was performed using the standard protocol following bolus administration of intravenous contrast. CONTRAST:  OMNIPAQUE IOHEXOL 300 MG/ML  SOLN COMPARISON:  None. FINDINGS: Lower chest: Lung bases are clear without focal nodule, mass, or airspace disease. Heart size is normal. No significant pleural or pericardial effusion. Hepatobiliary: No focal liver abnormality is seen. No gallstones, gallbladder wall thickening, or biliary dilatation. Pancreas: Mild pancreatic duct dilation is present. No obstructing mass lesion is evident. Pancreas is otherwise normal. Lipase is normal. Spleen: Normal in size without focal abnormality. Adrenals/Urinary Tract: The adrenal glands are normal bilaterally. Kidneys and ureters are unremarkable. The urinary bladder is within limits. Stomach/Bowel: Stomach and duodenum are within normal limits. The small bowel is unremarkable. The appendix is visualized and normal. The ascending and transverse colon are within limits. The descending colon is normal. There is focal wall thickening about the sigmoid colon adjacent mesenteric inflammatory change. Vascular/Lymphatic: No significant vascular findings are present. No enlarged abdominal or pelvic lymph nodes. Reproductive: The uterus is diffusely heterogeneous and edematous. Enhancing follicles are present bilaterally. Tubal ligation clips are noted. There is also a single clip posteriorly within the anatomic pelvis. A small amount of free fluid is present. Other: No ventral hernia is present. Musculoskeletal: Vertebral body heights alignment are maintained.  No focal lytic or blastic lesions are present. Bony pelvis is intact. The hips are located and within normal limits bilaterally. IMPRESSION: 1. Diffuse edematous changes of the uterus and enhancing cystic lesions in both ovaries. Please correlate with menstrual cycle. Inflammatory changes are not excluded. 2. Wall thickening of the distal sigmoid colon. This may be secondary  to adjacent inflammatory changes of the reproductive organs. Primary colitis is not excluded. 3. Free fluid in the anatomic pelvis is likely reactive. 4. Surgical clips lying dependently in the anatomic pelvis appears similar to other tubal ligation clips. Electronically Signed   By: Marin Roberts M.D.   On: 09/04/2018 11:50     ____________________________________________   PROCEDURES  Procedure(s) performed: None Procedures Critical Care performed:  None ____________________________________________   INITIAL IMPRESSION / ASSESSMENT AND PLAN / ED COURSE   42 y.o. female no significant past medical history who presents for evaluation of nausea, vomiting, diarrhea and abdominal pain.  Patient is well-appearing and in no distress, vitals show no abnormalities.  Abdomen is soft with diffuse tenderness in the lower quadrants, no rebound or guarding.  Labs including CBC, CMP, lipase, pregnancy, and urinalysis are pending.  Toradol, Zofran and fluids will be given for symptom relief. Ddx including viral gastroenteritis versus colitis versus enteritis versus C. difficile versus diverticulitis versus appendicitis.  Clinical Course as of Sep 04 1228  Tue Sep 04, 2018  1211 CT showing bilateral cyst adnexa with edematous uterus and possible colitis versus inflammatory changes from GYN pathology.  Patient is about to have her menstrual period and that could explain why she has edema in her uterus however I did recommend STD testing and transvaginal ultrasound.  Patient reports that she is been in a monogamous relationship for 2 years, she works and lives together with this man and she is 100% sure she cannot have an STD.  She has refused STD testing. She initially agrees with [place] especially to evaluate for TOA and to evaluate bilateral adnexa cysts to rule out torsion but then refuses [place] and is demanding discharge home. She agrees to take abx for colitis. I explained to her that if she has PID/ TOA/  or torsion she can get much sicker and need surgery with possible loss of her ovaries. She understands the risks and continues to demand discharge at this time.  Will provide patient with a prescription for Flagyl and Augmentin for colitis.  Discussed close follow-up with primary care doctor or return to the emergency room at any time to continue treatment.   [CV]    Clinical Course User Index [CV] Don Perking [place], MD     As part of my medical decision making, I reviewed the following data within the electronic MEDICAL RECORD NUMBER Nursing notes reviewed and incorporated, Labs reviewed , Old chart reviewed, Radiograph reviewed , Notes from prior ED visits and [place] Controlled Substance Database    Pertinent labs & imaging results that were available during my care of the patient were reviewed by me and considered in my medical decision making (see chart for details).    ____________________________________________   FINAL CLINICAL IMPRESSION(S) / ED DIAGNOSES  Final diagnoses:  Abdominal pain  Colitis  Uterine inflammation  Adnexal cyst      NEW MEDICATIONS STARTED DURING THIS VISIT:  ED Discharge Orders         Ordered    amoxicillin-clavulanate (AUGMENTIN) 875-125 MG tablet  2 times daily     09/04/18 1229    metroNIDAZOLE (FLAGYL) 500  MG tablet  3 times daily     09/04/18 1229           Note:  This document was prepared using Dragon voice recognition software and may include unintentional dictation errors.    Don Perking, [place], MD 09/04/18 1230

## 2018-09-04 NOTE — ED Notes (Signed)
Patient reports to this RN that she has been here for "4 hours and is ready to go now"; she is encouraged to continue with the treatment plan and allow for an ultrasound; but she refuses to go to ultrasound and insists on leaving now. MD Don Perking made aware.

## 2018-09-04 NOTE — ED Notes (Signed)
MD made aware of pt's desires to be discharged

## 2018-09-04 NOTE — ED Notes (Signed)
Pt states she wants to go home

## 2018-09-04 NOTE — ED Notes (Signed)
Patient transported to CT 

## 2018-09-04 NOTE — ED Triage Notes (Signed)
Patient reports abdominal cramping, N/V/D x4 days. Unsure of fever.

## 2020-01-29 IMAGING — CT CT ABD-PELV W/ CM
2 of 5 series · 16 of 46 positions shown, 18 images · IV contrast (APPLIED)
Comparison: None.

CLINICAL DATA: Nausea and vomiting for 4 days. Abdominal pain,
unspecified.

EXAM:
CT ABDOMEN AND PELVIS WITH CONTRAST
TECHNIQUE: Multidetector CT imaging of the abdomen and pelvis was performed
using the standard protocol following bolus administration of
intravenous contrast.
CONTRAST:  100mL OMNIPAQUE IOHEXOL 300 MG/ML  SOLN

[Series 2: routine abd/pel with · axial · 0.71mm/px · z∈[-462,-87]mm · 13 of 85 slices shown, 15 images]
[im 5/85  soft-tissue]
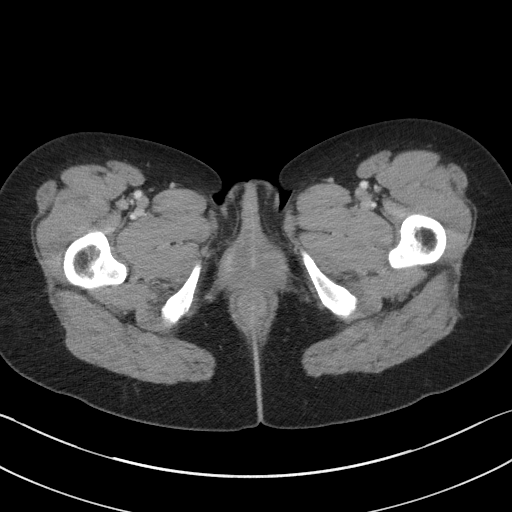
[im 5/85  bone]
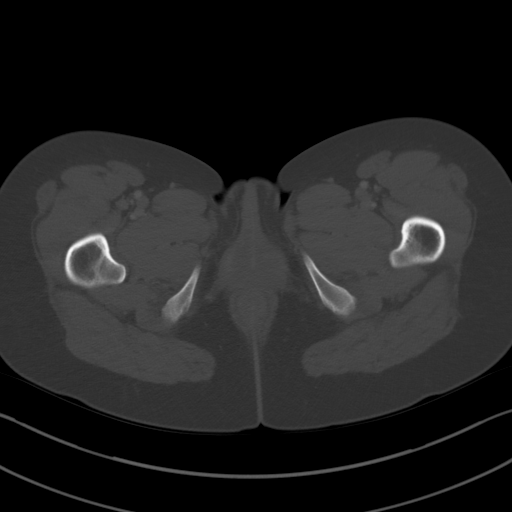
[im 14/85  soft-tissue]
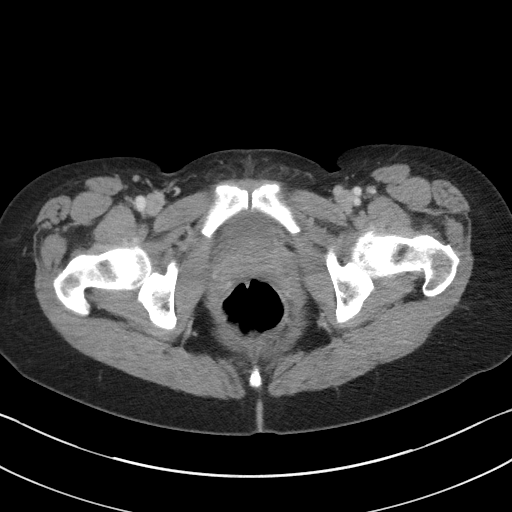
[im 18/85  soft-tissue]
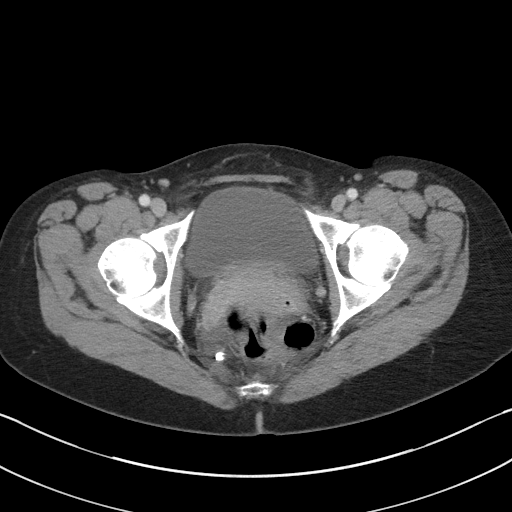
[im 23/85  soft-tissue]
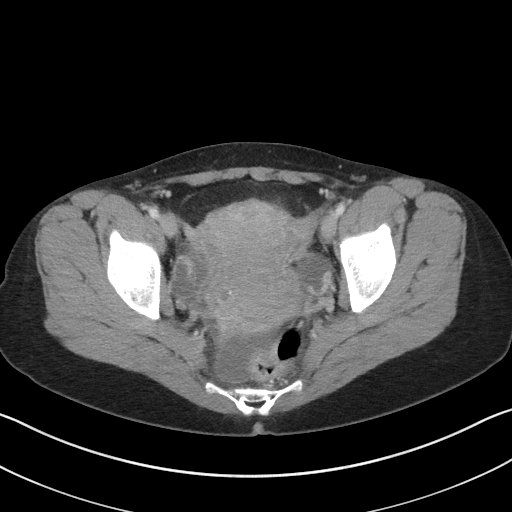
[im 31/85  soft-tissue]
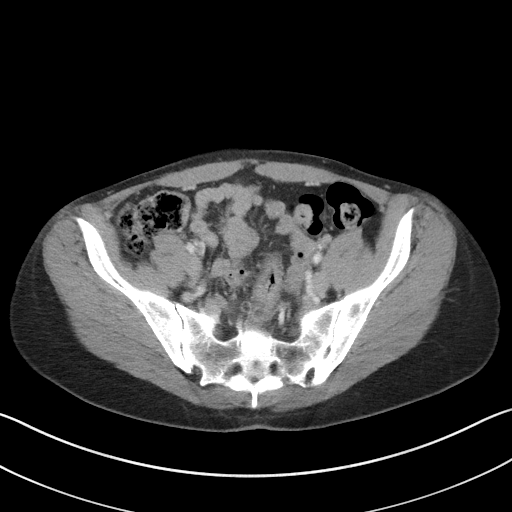
[im 36/85  soft-tissue]
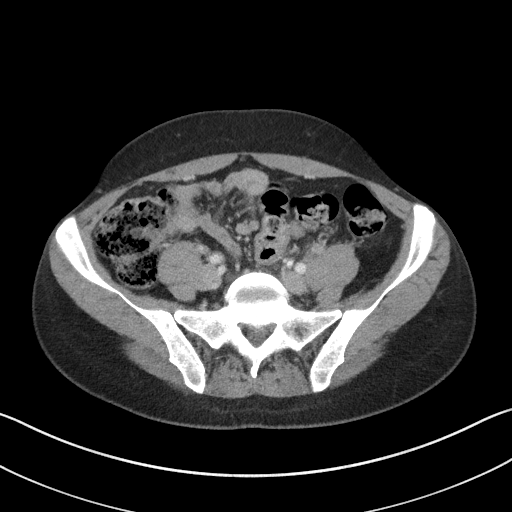
[im 45/85  soft-tissue]
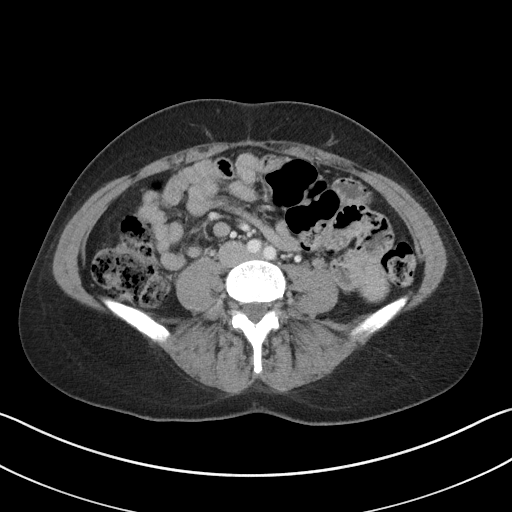
[im 49/85  soft-tissue]
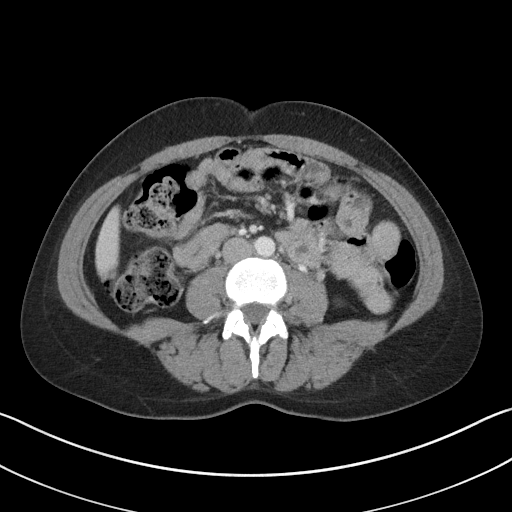
[im 54/85  soft-tissue]
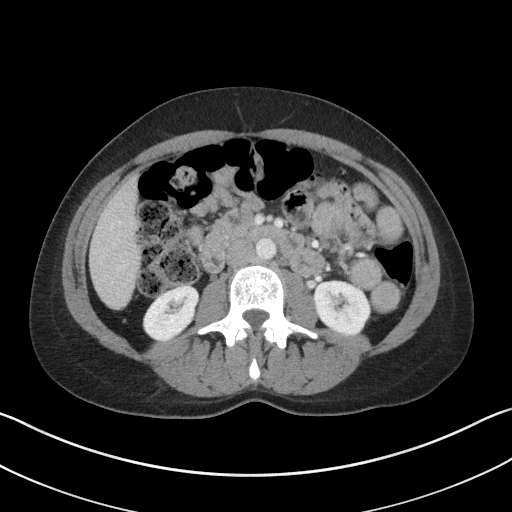
[im 54/85  bone]
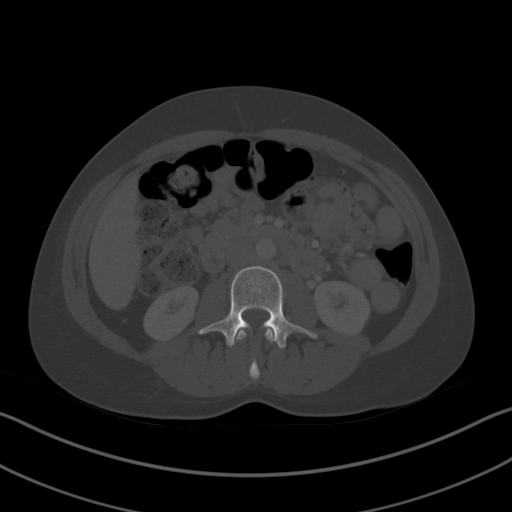
[im 62/85  soft-tissue]
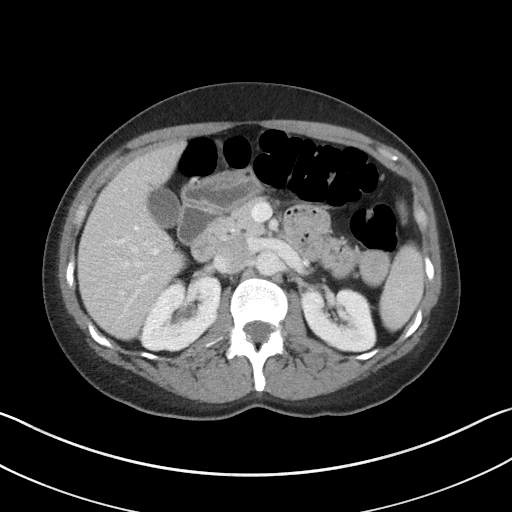
[im 67/85  soft-tissue]
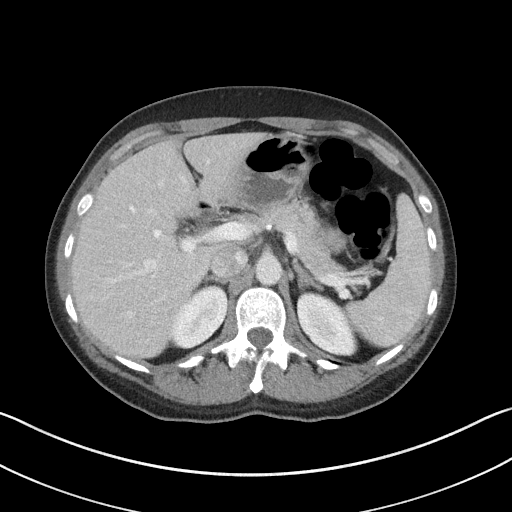
[im 71/85  soft-tissue]
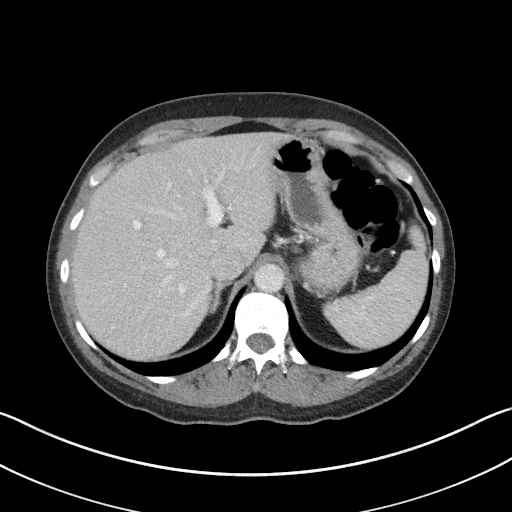
[im 80/85  soft-tissue]
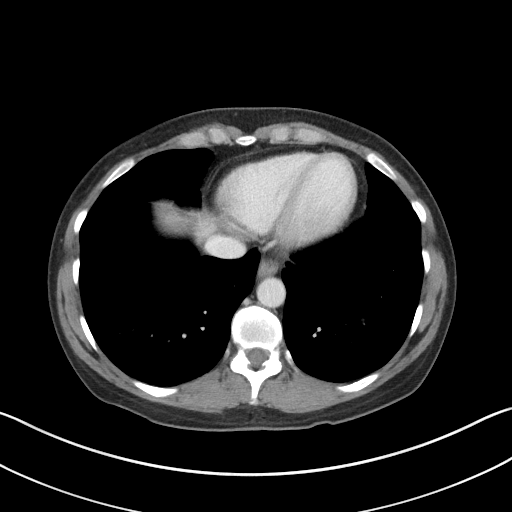

[Series 5: coronal st · coronal · 0.77mm/px · 3 of 78 slices shown]
[im 26/78  soft-tissue]
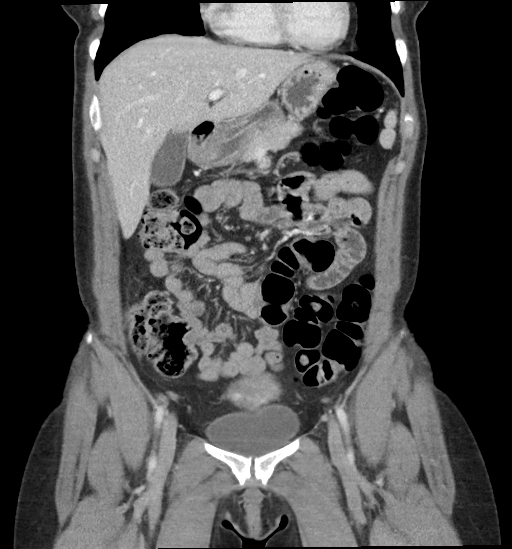
[im 35/78  soft-tissue]
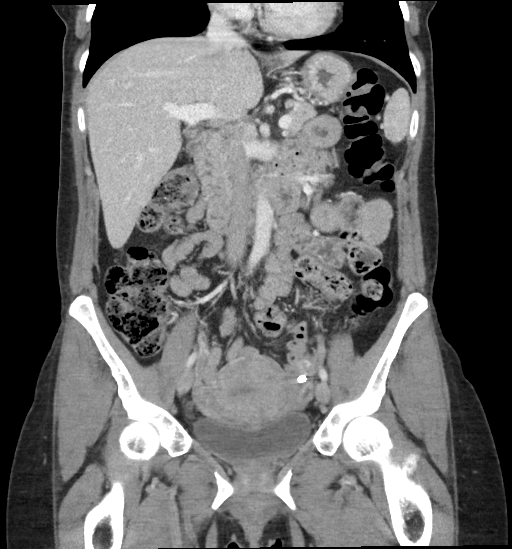
[im 43/78  soft-tissue]
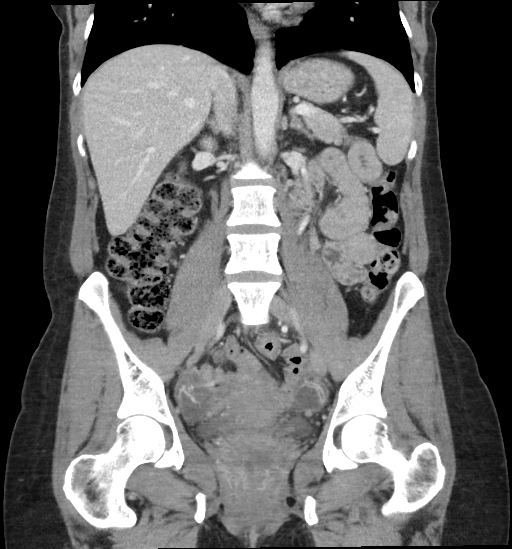

[16 of 46 positions shown; findings below may reference images not displayed]

FINDINGS: Lower chest: Lung bases are clear without focal nodule, mass, or
airspace disease. Heart size is normal. No significant pleural or
pericardial effusion.

Hepatobiliary: No focal liver abnormality is seen. No gallstones,
gallbladder wall thickening, or biliary dilatation.

Pancreas: Mild pancreatic duct dilation is present. No obstructing
mass lesion is evident. Pancreas is otherwise normal. Lipase is
normal.

Spleen: Normal in size without focal abnormality.

Adrenals/Urinary Tract: The adrenal glands are normal bilaterally.
Kidneys and ureters are unremarkable. The urinary bladder is within
limits.

Stomach/Bowel: Stomach and duodenum are within normal limits. The
small bowel is unremarkable. The appendix is visualized and normal.
The ascending and transverse colon are within limits. The descending
colon is normal. There is focal wall thickening about the sigmoid
colon adjacent mesenteric inflammatory change.

Vascular/Lymphatic: No significant vascular findings are present. No
enlarged abdominal or pelvic lymph nodes.

Reproductive: The uterus is diffusely heterogeneous and edematous.
Enhancing follicles are present bilaterally. Tubal ligation clips
are noted. There is also a single clip posteriorly within the
anatomic pelvis. A small amount of free fluid is present.

Other: No ventral hernia is present.

Musculoskeletal: Vertebral body heights alignment are maintained. No
focal lytic or blastic lesions are present. Bony pelvis is intact.
The hips are located and within normal limits bilaterally.
IMPRESSION: 1. Diffuse edematous changes of the uterus and enhancing cystic
lesions in both ovaries. Please correlate with menstrual cycle.
Inflammatory changes are not excluded.
2. Wall thickening of the distal sigmoid colon. This may be
secondary to adjacent inflammatory changes of the reproductive
organs. Primary colitis is not excluded.
3. Free fluid in the anatomic pelvis is likely reactive.
4. Surgical clips lying dependently in the anatomic pelvis appears
similar to other tubal ligation clips.
